# Patient Record
Sex: Female | Born: 1985 | Race: White | Hispanic: No | Marital: Single | Smoking: Never smoker
Health system: Southern US, Community
[De-identification: ages and names within clinical notes are randomized; demographics above are authoritative.]

## PROBLEM LIST (undated history)

## (undated) DIAGNOSIS — I729 Aneurysm of unspecified site: Secondary | ICD-10-CM

---
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
## 2009-07-17 ENCOUNTER — Ambulatory Visit: Payer: Self-pay | Admitting: Family Medicine

## 2010-03-12 ENCOUNTER — Ambulatory Visit: Payer: Self-pay | Admitting: Internal Medicine

## 2010-04-16 ENCOUNTER — Ambulatory Visit: Payer: Self-pay | Admitting: Internal Medicine

## 2010-07-30 ENCOUNTER — Ambulatory Visit: Payer: Self-pay | Admitting: Internal Medicine

## 2010-09-17 ENCOUNTER — Ambulatory Visit: Payer: Self-pay | Admitting: Internal Medicine

## 2011-08-01 IMAGING — CT CT HEAD WITHOUT CONTRAST
1 series · 16 of 29 positions shown, 20 images · non-contrast
Comparison: none

REASON FOR EXAM: HEADACHE
COMMENTS:

[Series 2: soft tissue · axial · 0.45mm/px · z∈[-126,+4]mm · 16 of 29 slices shown, 20 images]
[im 2/29  brain]
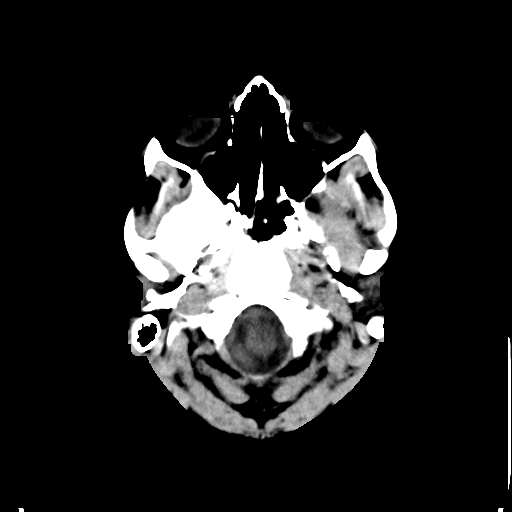
[im 2/29  bone]
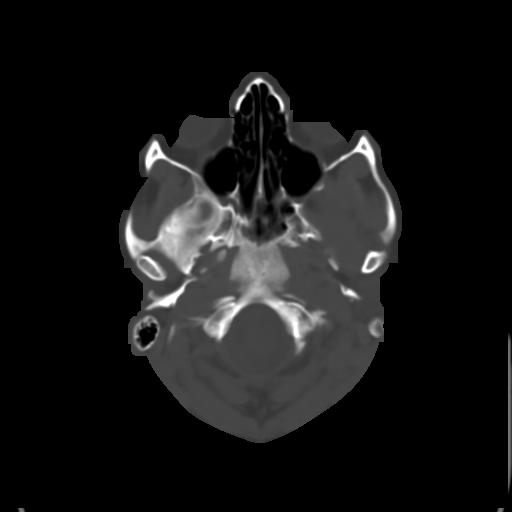
[im 4/29  brain]
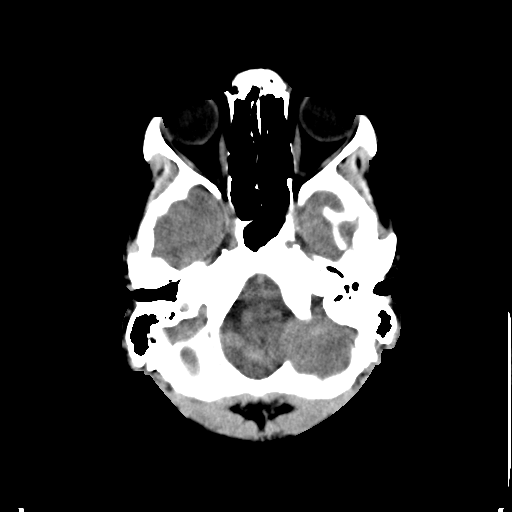
[im 6/29  brain]
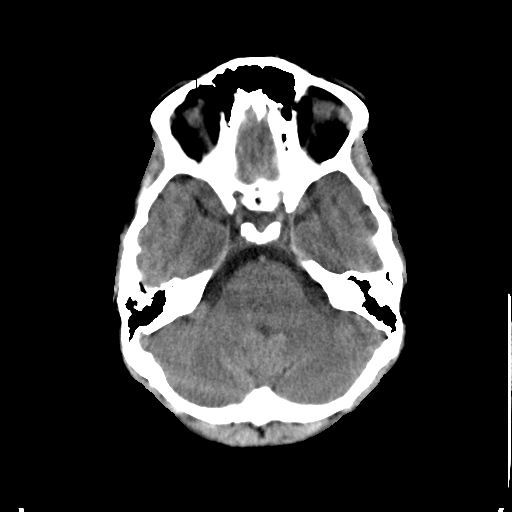
[im 7/29  brain]
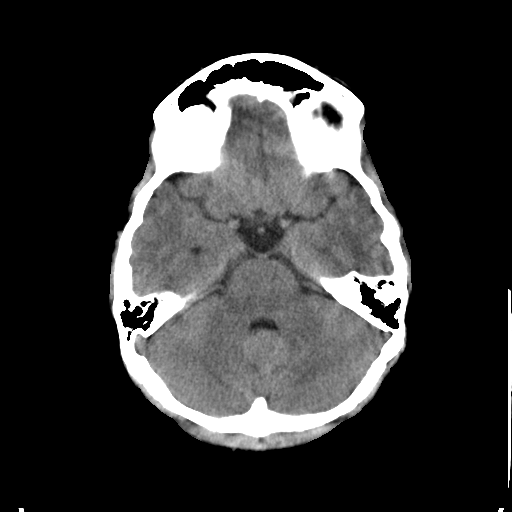
[im 9/29  brain]
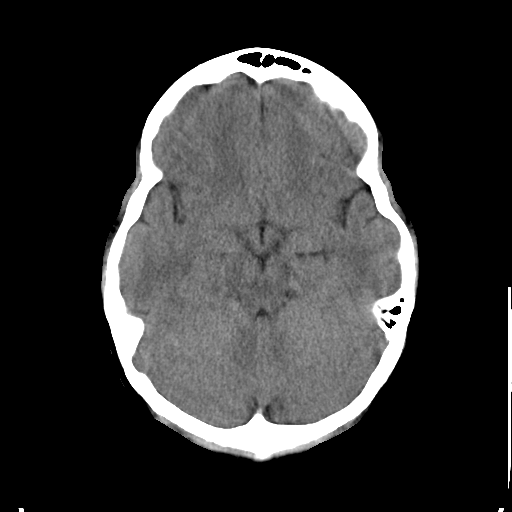
[im 9/29  bone]
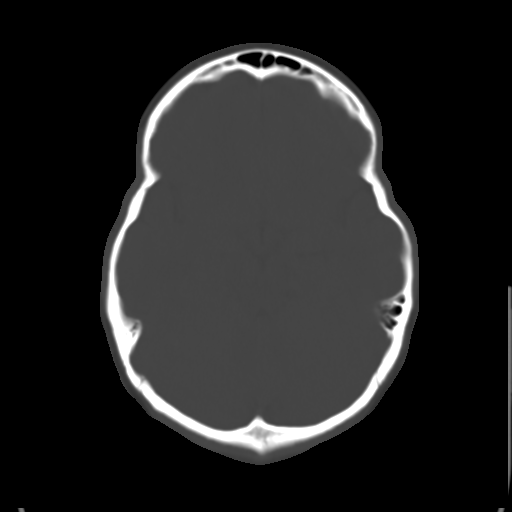
[im 11/29  brain]
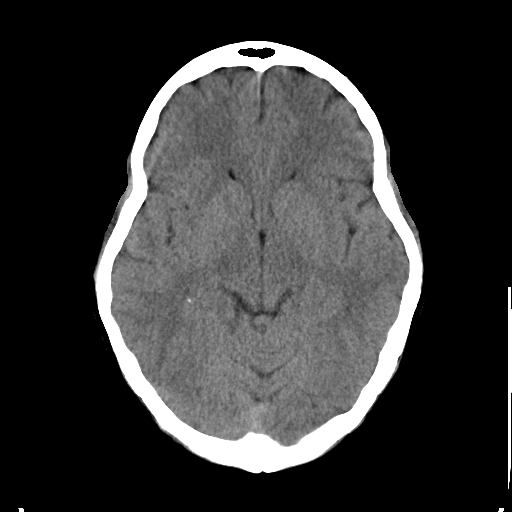
[im 12/29  brain]
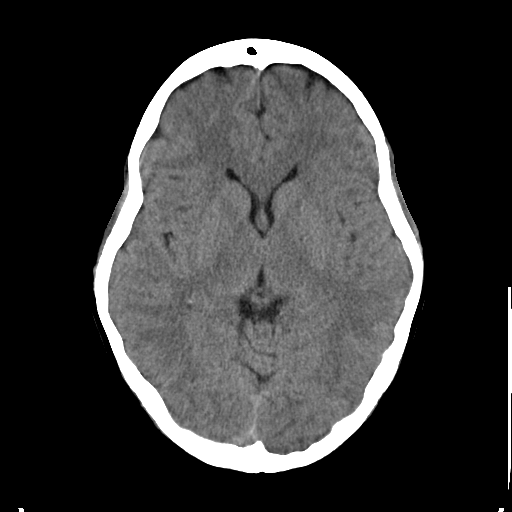
[im 14/29  brain]
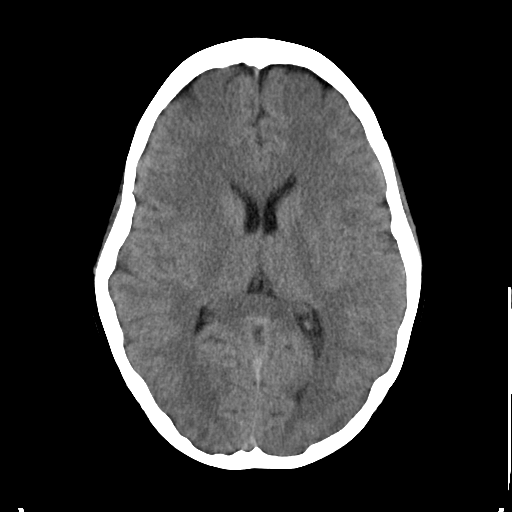
[im 16/29  brain]
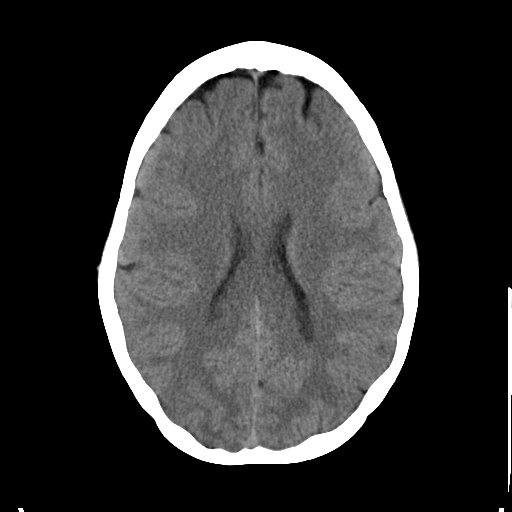
[im 16/29  bone]
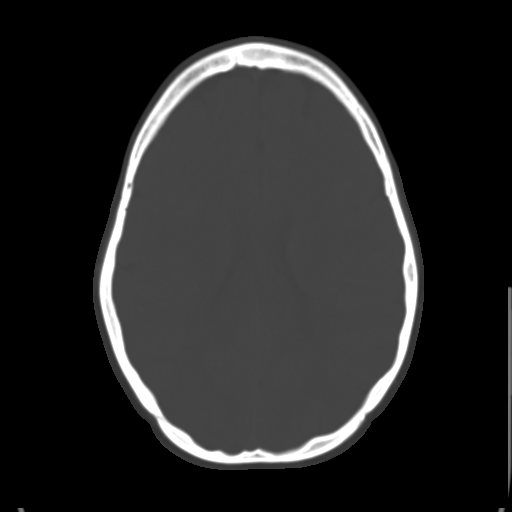
[im 18/29  brain]
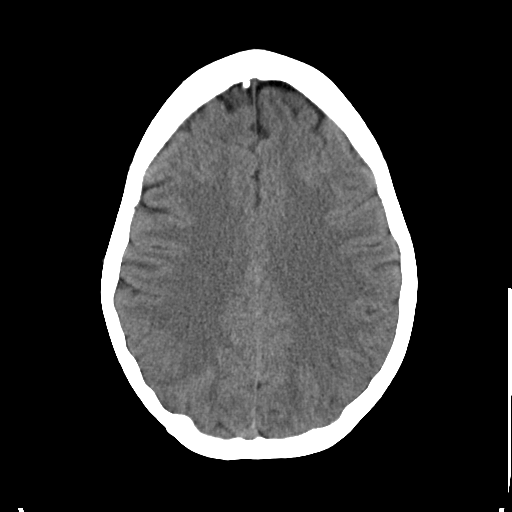
[im 19/29  brain]
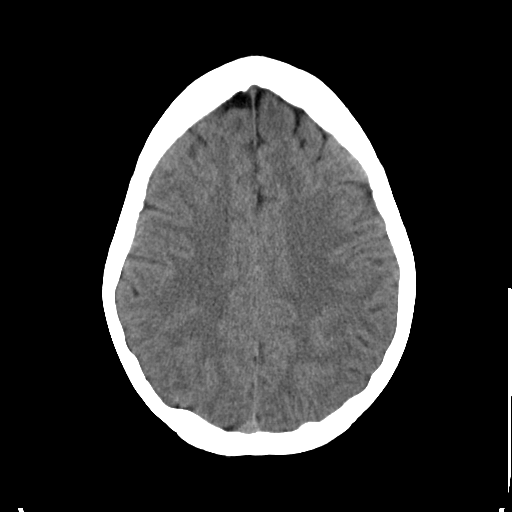
[im 21/29  brain]
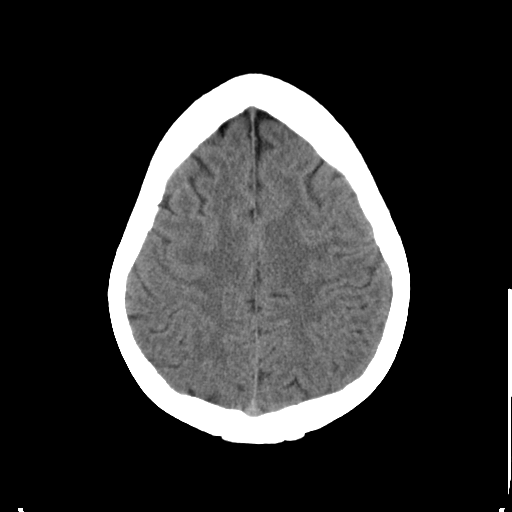
[im 23/29  brain]
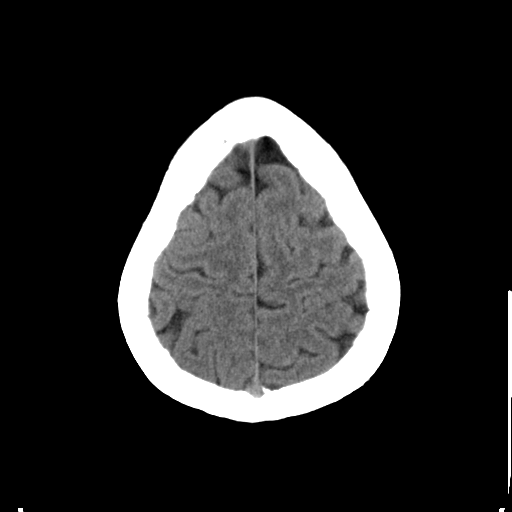
[im 23/29  bone]
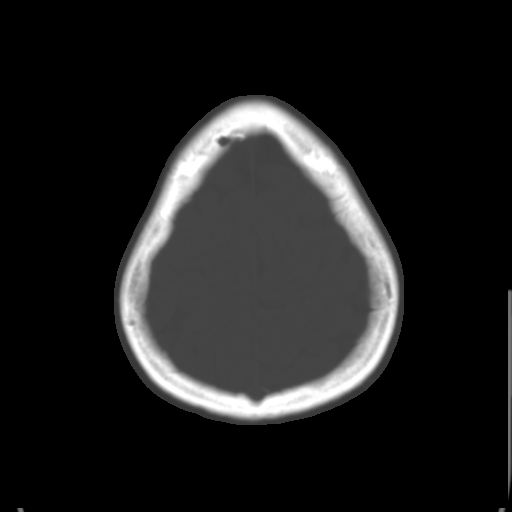
[im 24/29  brain]
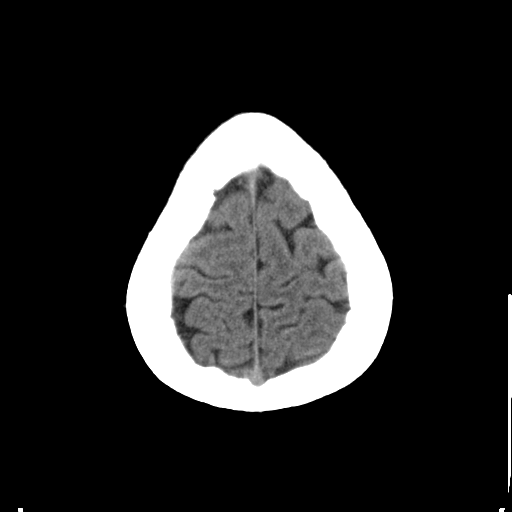
[im 26/29  brain]
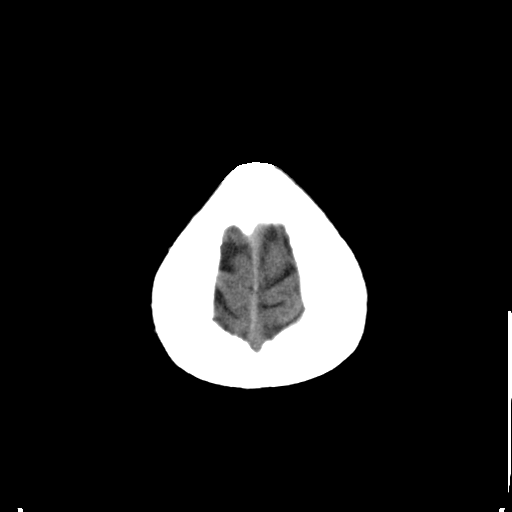
[im 28/29  brain]
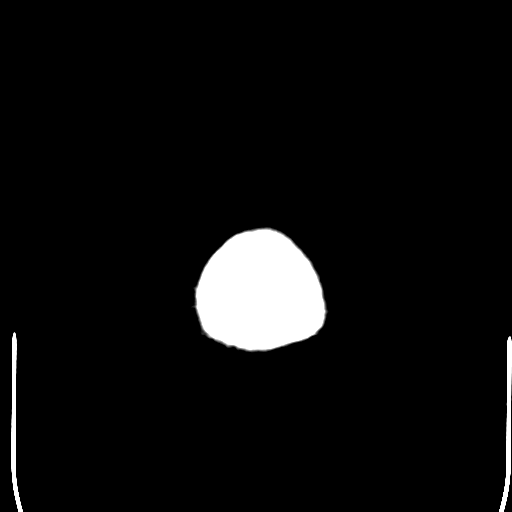

[16 of 29 positions shown; findings below may reference images not displayed]

PROCEDURE:     TOVO - LKW TIGER WITHOUT CONTRAST  - July 17, 2009  [DATE]

RESULT:     Noncontrast CT of the brain is performed in the standard
fashion. The patient has no previous examination for comparison.

The ventricles and sulci are normal. There is no hemorrhage. There is no
focal mass, mass-effect or midline shift. There is no evidence of edema or
territorial infarct. The bone windows demonstrate normal aeration of the
paranasal sinuses and mastoid air cells. There is no skull fracture
demonstrated.
IMPRESSION: 1. No acute intracranial abnormality.

## 2012-03-26 ENCOUNTER — Emergency Department: Payer: Self-pay | Admitting: Emergency Medicine

## 2012-04-03 ENCOUNTER — Ambulatory Visit: Payer: Self-pay | Admitting: Internal Medicine

## 2012-04-05 LAB — BETA STREP CULTURE(ARMC)

## 2012-11-20 ENCOUNTER — Ambulatory Visit: Payer: Self-pay | Admitting: Physician Assistant
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
## 2021-03-28 ENCOUNTER — Other Ambulatory Visit: Payer: Self-pay

## 2021-03-28 ENCOUNTER — Ambulatory Visit
Admission: EM | Admit: 2021-03-28 | Discharge: 2021-03-28 | Disposition: A | Discharge status: Home / Self Care | Payer: Federal, State, Local not specified - PPO | Attending: Physician Assistant | Admitting: Physician Assistant

## 2021-03-28 ENCOUNTER — Encounter: Payer: Self-pay | Admitting: Emergency Medicine

## 2021-03-28 DIAGNOSIS — R051 Acute cough: Secondary | ICD-10-CM | POA: Insufficient documentation

## 2021-03-28 DIAGNOSIS — R509 Fever, unspecified: Secondary | ICD-10-CM | POA: Diagnosis not present

## 2021-03-28 DIAGNOSIS — J069 Acute upper respiratory infection, unspecified: Secondary | ICD-10-CM | POA: Insufficient documentation

## 2021-03-28 DIAGNOSIS — Z20822 Contact with and (suspected) exposure to covid-19: Secondary | ICD-10-CM | POA: Insufficient documentation

## 2021-03-28 HISTORY — DX: Aneurysm of unspecified site: I72.9

## 2021-03-28 LAB — RAPID INFLUENZA A&B ANTIGENS
Influenza A (ARMC): NEGATIVE
Influenza B (ARMC): NEGATIVE

## 2021-03-28 NOTE — ED Triage Notes (Signed)
Onset 03/26/2021 of symptoms.  Symptoms have significantly worsened since yesterday.  Body aches, head congestion, sore throat and exhausted

## 2021-03-28 NOTE — ED Provider Notes (Signed)
[facility]    CSN: [id number] Arrival date & time: 03/28/21  1355      History   Chief Complaint Chief Complaint  Patient presents with   Fever    HPI Sheila Huynh is a 35 y.o. female presenting for 2-day history of fever, fatigue, body aches, congestion, sore throat.  Patient reports she feels like she has the flu.  Possibly exposed to influenza last week but she says her stepdaughter who tested positive for the flu did not come around her until she was fever free.  No COVID exposure.  No personal history of COVID-19.  Has been taking over-the-counter medication for symptoms.  No breathing difficulty, vomiting or diarrhea.  No other complaints.  HPI  Past Medical History:  Diagnosis Date   Aneurysm (HCC)     There are no problems to display for this patient.   Past Surgical History:  Procedure Laterality Date   CARDIAC SURGERY      OB History   No obstetric history on file.      Home Medications    Prior to Admission medications   Medication Sig Start Date End Date Taking? Authorizing Provider  albuterol (VENTOLIN HFA) 108 (90 Base) MCG/ACT inhaler Inhale into the lungs. 12/05/17  Yes [provider]  SUMAtriptan (IMITREX) 25 MG tablet Take 1 tablet by mouth 2 (two) times daily as needed. 07/25/19  Yes [provider]  zonisamide (ZONEGRAN) 100 MG capsule Take 2 capsules by mouth daily. 08/18/19  Yes [provider]  amitriptyline (ELAVIL) 25 MG tablet Take 25 mg by mouth at bedtime. 03/25/21   [provider]  aspirin 81 MG chewable tablet Chew by mouth.    [provider]  buPROPion (WELLBUTRIN XL) 300 MG 24 hr tablet Take by mouth.    [provider]  rosuvastatin (CRESTOR) 10 MG tablet Take 10 mg by mouth at bedtime. 02/09/21   [provider]    Family History Family History  Problem Relation Age of Onset   Hypertension Mother    Hypertension Father     Social History Social  History   Tobacco Use   Smoking status: Never   Smokeless tobacco: Never  Vaping Use   Vaping Use: Never used  Substance Use Topics   Alcohol use: Never   Drug use: Never     Allergies   Patient has no known allergies.   Review of Systems Review of Systems  Constitutional:  Positive for fatigue and fever. Negative for chills and diaphoresis.  HENT:  Positive for congestion, rhinorrhea and sore throat. Negative for ear pain, sinus pressure and sinus pain.   Respiratory:  Negative for cough and shortness of breath.   Cardiovascular:  Negative for chest pain.  Gastrointestinal:  Negative for abdominal pain, nausea and vomiting.  Musculoskeletal:  Positive for myalgias. Negative for arthralgias.  Skin:  Negative for rash.  Neurological:  Negative for weakness and headaches.  Hematological:  Negative for adenopathy.    Physical Exam Triage Vital Signs ED Triage Vitals  Enc Vitals Group     BP 03/28/21 1525 125/87     Pulse Rate 03/28/21 1525 84     Resp 03/28/21 1525 18     Temp 03/28/21 1525 97.8 F (36.6 C)     Temp Source 03/28/21 1525 Oral     SpO2 03/28/21 1525 100 %     Weight --      Height --      Head  Circumference --      Peak Flow --      Pain Score 03/28/21 1524 2     Pain Loc --      Pain Edu? --      Excl. in GC? --    No data found.  Updated Vital Signs BP 125/87 (BP Location: Left Arm)    Pulse 84    Temp 97.8 F (36.6 C) (Oral)    Resp 18    LMP 03/24/2021    SpO2 100%     Physical Exam Vitals and nursing note reviewed.  Constitutional:      General: She is not in acute distress.    Appearance: Normal appearance. She is ill-appearing. She is not toxic-appearing.  HENT:     Head: Normocephalic and atraumatic.     Nose: Congestion present.     Mouth/Throat:     Mouth: Mucous membranes are moist.     Pharynx: Oropharynx is clear.  Eyes:     General: No scleral icterus.       Right eye: No discharge.        Left eye: No discharge.      Conjunctiva/sclera: Conjunctivae normal.  Cardiovascular:     Rate and Rhythm: Normal rate and regular rhythm.     Heart sounds: Normal heart sounds.  Pulmonary:     Effort: Pulmonary effort is normal. No respiratory distress.     Breath sounds: Normal breath sounds.  Musculoskeletal:     Cervical back: Neck supple.  Skin:    General: Skin is dry.  Neurological:     General: No focal deficit present.     Mental Status: She is alert. Mental status is at baseline.     Motor: No weakness.     Gait: Gait normal.  Psychiatric:        Mood and Affect: Mood normal.        Behavior: Behavior normal.        Thought Content: Thought content normal.     UC Treatments / Results  Labs (all labs ordered are listed, but only abnormal results are displayed) Labs Reviewed  RAPID INFLUENZA A&B ANTIGENS  SARS CORONAVIRUS 2 (TAT 6-24 HRS)    EKG   Radiology No results found.  Procedures Procedures (including critical care time)  Medications Ordered in UC Medications - No data to display  Initial Impression / Assessment and Plan / UC Course  I have reviewed the triage vital signs and the nursing notes.  Pertinent labs & imaging results that were available during my care of the patient were reviewed by me and considered in my medical decision making (see chart for details).    35 year old female presenting for 2-day history of fever, fatigue, congestion.  Vitals all stable.  Patient is ill-appearing but nontoxic.  On exam she has mild nasal congestion, otherwise normal.  Chest clear auscultation heart regular rate and rhythm.  Rapid flu testing performed and results are negative.  PCR COVID test obtained.  Current CDC guidelines, isolation protocol and ED precautions reviewed if COVID-positive.   Advised she likely has a viral illness.  Supportive care as discussed.  Increase rest and fluids.  Offer cough medicine but she will continue over-the-counter cough medicine.  Reviewed  return and ER precautions.  Declines work note.  Final Clinical Impressions(s) / UC Diagnoses   Final diagnoses:  Viral upper respiratory tract infection  Acute cough  Fever, unspecified     Discharge Instructions  You have received COVID testing today either for positive exposure, concerning symptoms that could be related to COVID infection, screening purposes, or re-testing after confirmed positive.  Your test obtained today checks for active viral infection in the last 1-2 weeks. If your test is negative now, you can still test positive later. So, if you do develop symptoms you should either get re-tested and/or isolate x 5 days and then strict mask use x 5 days (unvaccinated) or mask use x 10 days (vaccinated). Please follow CDC guidelines.  While Rapid antigen tests come back in 15-20 minutes, send out PCR/molecular test results typically come back within 1-3 days. In the mean time, if you are symptomatic, assume this could be a positive test and treat/monitor yourself as if you do have COVID.   We will call with test results if positive. Please download the MyChart app and set up a profile to access test results.   If symptomatic, go home and rest. Push fluids. Take Tylenol as needed for discomfort. Gargle warm salt water. Throat lozenges. Take Mucinex DM or Robitussin for cough. Humidifier in bedroom to ease coughing. Warm showers. Also review the COVID handout for more information.  COVID-19 INFECTION: The incubation period of COVID-19 is approximately 14 days after exposure, with most symptoms developing in roughly 4-5 days. Symptoms may range in severity from mild to critically severe. Roughly 80% of those infected will have mild symptoms. People of any age may become infected with COVID-19 and have the ability to transmit the virus. The most common symptoms include: fever, fatigue, cough, body aches, headaches, sore throat, nasal congestion, shortness of breath, nausea,  vomiting, diarrhea, changes in smell and/or taste.    COURSE OF ILLNESS Some patients may begin with mild disease which can progress quickly into critical symptoms. If your symptoms are worsening please call ahead to the Emergency Department and proceed there for further treatment. Recovery time appears to be roughly 1-2 weeks for mild symptoms and 3-6 weeks for severe disease.   GO IMMEDIATELY TO ER FOR FEVER YOU ARE UNABLE TO GET DOWN WITH TYLENOL, BREATHING PROBLEMS, CHEST PAIN, FATIGUE, LETHARGY, INABILITY TO EAT OR DRINK, ETC  QUARANTINE AND ISOLATION: To help decrease the spread of COVID-19 please remain isolated if you have COVID infection or are highly suspected to have COVID infection. This means -stay home and isolate to one room in the home if you live with others. Do not share a bed or bathroom with others while ill, sanitize and wipe down all countertops and keep common areas clean and disinfected. Stay home for 5 days. If you have no symptoms or your symptoms are resolving after 5 days, you can leave your house. Continue to wear a mask around others for 5 additional days. If you have been in close contact (within 6 feet) of someone diagnosed with COVID 19, you are advised to quarantine in your home for 14 days as symptoms can develop anywhere from 2-14 days after exposure to the virus. If you develop symptoms, you  must isolate.  Most current guidelines for COVID after exposure -unvaccinated: isolate 5 days and strict mask use x 5 days. Test on day 5 is possible -vaccinated: wear mask x 10 days if symptoms do not develop -You do not necessarily need to be tested for COVID if you have + exposure and  develop symptoms. Just isolate at home x10 days from symptom onset During this global pandemic, CDC advises to practice social distancing, try to stay at least  23ft away from others at all times. Wear a face covering. Wash and sanitize your hands regularly and avoid going anywhere that is not  necessary.  KEEP IN MIND THAT THE COVID TEST IS NOT 100% ACCURATE AND YOU SHOULD STILL DO EVERYTHING TO PREVENT POTENTIAL SPREAD OF VIRUS TO OTHERS (WEAR MASK, WEAR GLOVES, WASH HANDS AND SANITIZE REGULARLY). IF INITIAL TEST IS NEGATIVE, THIS MAY NOT MEAN YOU ARE DEFINITELY NEGATIVE. MOST ACCURATE TESTING IS DONE 5-7 DAYS AFTER EXPOSURE.   It is not advised by CDC to get re-tested after receiving a positive COVID test since you can still test positive for weeks to months after you have already cleared the virus.   *If you have not been vaccinated for COVID, I strongly suggest you consider getting vaccinated as long as there are no contraindications.       ED Prescriptions   None    PDMP not reviewed this encounter.   Shirlee Latch, PA-C 03/28/21 [phone]

## 2021-03-28 NOTE — Discharge Instructions (Signed)

## 2021-03-29 LAB — SARS CORONAVIRUS 2 (TAT 6-24 HRS): SARS Coronavirus 2: NEGATIVE

## 2022-07-07 ENCOUNTER — Ambulatory Visit: Payer: Federal, State, Local not specified - PPO

## 2022-07-07 DIAGNOSIS — R1032 Left lower quadrant pain: Secondary | ICD-10-CM | POA: Diagnosis not present

## 2022-07-07 DIAGNOSIS — K591 Functional diarrhea: Secondary | ICD-10-CM | POA: Diagnosis not present

## 2022-07-07 DIAGNOSIS — K2970 Gastritis, unspecified, without bleeding: Secondary | ICD-10-CM | POA: Diagnosis not present

## 2022-07-07 DIAGNOSIS — R112 Nausea with vomiting, unspecified: Secondary | ICD-10-CM | POA: Diagnosis not present

## 2022-07-07 DIAGNOSIS — K582 Mixed irritable bowel syndrome: Secondary | ICD-10-CM | POA: Diagnosis not present

## 2022-07-07 DIAGNOSIS — R1031 Right lower quadrant pain: Secondary | ICD-10-CM | POA: Diagnosis present
